# Patient Record
Sex: Female | Born: 1965 | Race: White | Hispanic: No | Smoking: Never smoker
Health system: Southern US, Community
[De-identification: ages and names within clinical notes are randomized; demographics above are authoritative.]

## PROBLEM LIST (undated history)

## (undated) DIAGNOSIS — N3281 Overactive bladder: Secondary | ICD-10-CM

## (undated) DIAGNOSIS — E669 Obesity, unspecified: Secondary | ICD-10-CM

## (undated) DIAGNOSIS — K219 Gastro-esophageal reflux disease without esophagitis: Secondary | ICD-10-CM

## (undated) DIAGNOSIS — F32A Depression, unspecified: Secondary | ICD-10-CM

## (undated) HISTORY — PX: ESOPHAGOGASTRODUODENOSCOPY: SHX1529

## (undated) HISTORY — PX: UPPER GASTROINTESTINAL ENDOSCOPY: SHX188

## (undated) HISTORY — PX: CHOLECYSTECTOMY: SHX55

## (undated) HISTORY — DX: Depression, unspecified: F32.A

## (undated) HISTORY — DX: Obesity, unspecified: E66.9

## (undated) HISTORY — DX: Gastro-esophageal reflux disease without esophagitis: K21.9

## (undated) HISTORY — DX: Overactive bladder: N32.81

## (undated) HISTORY — PX: TUBAL LIGATION: SHX77

---

## 1992-09-10 DIAGNOSIS — E079 Disorder of thyroid, unspecified: Secondary | ICD-10-CM

## 1992-09-10 HISTORY — DX: Disorder of thyroid, unspecified: E07.9

## 1999-10-17 ENCOUNTER — Other Ambulatory Visit: Admission: RE | Admit: 1999-10-17 | Discharge: 1999-10-17 | Payer: Self-pay | Admitting: Gynecology

## 2000-10-31 ENCOUNTER — Other Ambulatory Visit: Admission: RE | Admit: 2000-10-31 | Discharge: 2000-10-31 | Payer: Self-pay | Admitting: Gynecology

## 2001-11-24 ENCOUNTER — Other Ambulatory Visit: Admission: RE | Admit: 2001-11-24 | Discharge: 2001-11-24 | Payer: Self-pay | Admitting: Obstetrics and Gynecology

## 2003-04-15 ENCOUNTER — Other Ambulatory Visit: Admission: RE | Admit: 2003-04-15 | Discharge: 2003-04-15 | Payer: Self-pay | Admitting: Obstetrics and Gynecology

## 2003-09-11 HISTORY — PX: CHOLECYSTECTOMY: SHX55

## 2004-06-01 ENCOUNTER — Other Ambulatory Visit: Admission: RE | Admit: 2004-06-01 | Discharge: 2004-06-01 | Payer: Self-pay | Admitting: Obstetrics and Gynecology

## 2005-07-25 ENCOUNTER — Other Ambulatory Visit: Admission: RE | Admit: 2005-07-25 | Discharge: 2005-07-25 | Payer: Self-pay | Admitting: Obstetrics and Gynecology

## 2007-05-08 ENCOUNTER — Encounter: Admission: RE | Admit: 2007-05-08 | Discharge: 2007-05-08 | Payer: Self-pay | Admitting: Obstetrics and Gynecology

## 2008-05-24 ENCOUNTER — Encounter: Admission: RE | Admit: 2008-05-24 | Discharge: 2008-05-24 | Payer: Self-pay | Admitting: Obstetrics and Gynecology

## 2009-06-08 ENCOUNTER — Encounter: Admission: RE | Admit: 2009-06-08 | Discharge: 2009-06-08 | Payer: Self-pay | Admitting: Obstetrics and Gynecology

## 2009-06-14 ENCOUNTER — Encounter: Admission: RE | Admit: 2009-06-14 | Discharge: 2009-06-14 | Payer: Self-pay | Admitting: Obstetrics and Gynecology

## 2009-09-10 HISTORY — PX: TUBAL LIGATION: SHX77

## 2010-01-12 ENCOUNTER — Ambulatory Visit (HOSPITAL_COMMUNITY): Admission: RE | Admit: 2010-01-12 | Discharge: 2010-01-12 | Payer: Self-pay | Admitting: Obstetrics and Gynecology

## 2010-06-20 ENCOUNTER — Encounter: Admission: RE | Admit: 2010-06-20 | Discharge: 2010-06-20 | Payer: Self-pay | Admitting: Obstetrics and Gynecology

## 2010-10-01 ENCOUNTER — Encounter: Payer: Self-pay | Admitting: Obstetrics and Gynecology

## 2010-11-28 LAB — CBC
HCT: 37.8 % (ref 36.0–46.0)
Hemoglobin: 13 g/dL (ref 12.0–15.0)
MCHC: 34.4 g/dL (ref 30.0–36.0)
MCV: 87.6 fL (ref 78.0–100.0)
Platelets: 191 10*3/uL (ref 150–400)
RDW: 13 % (ref 11.5–15.5)

## 2011-05-30 ENCOUNTER — Other Ambulatory Visit: Payer: Self-pay | Admitting: Obstetrics and Gynecology

## 2011-05-30 DIAGNOSIS — Z1231 Encounter for screening mammogram for malignant neoplasm of breast: Secondary | ICD-10-CM

## 2011-06-22 ENCOUNTER — Ambulatory Visit
Admission: RE | Admit: 2011-06-22 | Discharge: 2011-06-22 | Disposition: A | Discharge status: Home / Self Care | Payer: 59 | Source: Ambulatory Visit | Attending: Obstetrics and Gynecology | Admitting: Obstetrics and Gynecology

## 2011-06-22 DIAGNOSIS — Z1231 Encounter for screening mammogram for malignant neoplasm of breast: Secondary | ICD-10-CM

## 2012-06-19 ENCOUNTER — Other Ambulatory Visit: Payer: Self-pay | Admitting: Obstetrics and Gynecology

## 2012-06-19 DIAGNOSIS — Z1231 Encounter for screening mammogram for malignant neoplasm of breast: Secondary | ICD-10-CM

## 2012-07-07 ENCOUNTER — Ambulatory Visit
Admission: RE | Admit: 2012-07-07 | Discharge: 2012-07-07 | Disposition: A | Discharge status: Home / Self Care | Payer: 59 | Source: Ambulatory Visit | Attending: Obstetrics and Gynecology | Admitting: Obstetrics and Gynecology

## 2012-07-07 DIAGNOSIS — Z1231 Encounter for screening mammogram for malignant neoplasm of breast: Secondary | ICD-10-CM

## 2013-07-08 ENCOUNTER — Other Ambulatory Visit: Payer: Self-pay

## 2013-07-08 DIAGNOSIS — Z1231 Encounter for screening mammogram for malignant neoplasm of breast: Secondary | ICD-10-CM

## 2013-08-31 ENCOUNTER — Ambulatory Visit
Admission: RE | Admit: 2013-08-31 | Discharge: 2013-08-31 | Disposition: A | Discharge status: Home / Self Care | Payer: BC Managed Care – PPO | Source: Ambulatory Visit

## 2013-08-31 DIAGNOSIS — Z1231 Encounter for screening mammogram for malignant neoplasm of breast: Secondary | ICD-10-CM

## 2014-08-13 ENCOUNTER — Other Ambulatory Visit: Payer: Self-pay

## 2014-08-13 DIAGNOSIS — Z1231 Encounter for screening mammogram for malignant neoplasm of breast: Secondary | ICD-10-CM

## 2014-09-01 ENCOUNTER — Ambulatory Visit
Admission: RE | Admit: 2014-09-01 | Discharge: 2014-09-01 | Disposition: A | Discharge status: Home / Self Care | Payer: BC Managed Care – PPO | Source: Ambulatory Visit

## 2014-09-01 DIAGNOSIS — Z1231 Encounter for screening mammogram for malignant neoplasm of breast: Secondary | ICD-10-CM

## 2015-02-10 DIAGNOSIS — M222X2 Patellofemoral disorders, left knee: Secondary | ICD-10-CM | POA: Insufficient documentation

## 2015-02-10 DIAGNOSIS — M25562 Pain in left knee: Secondary | ICD-10-CM | POA: Insufficient documentation

## 2015-12-14 ENCOUNTER — Other Ambulatory Visit: Payer: Self-pay

## 2015-12-14 DIAGNOSIS — Z1231 Encounter for screening mammogram for malignant neoplasm of breast: Secondary | ICD-10-CM

## 2016-01-03 ENCOUNTER — Other Ambulatory Visit: Payer: Self-pay | Admitting: Obstetrics and Gynecology

## 2016-01-03 ENCOUNTER — Ambulatory Visit
Admission: RE | Admit: 2016-01-03 | Discharge: 2016-01-03 | Disposition: A | Discharge status: Home / Self Care | Payer: Managed Care, Other (non HMO) | Source: Ambulatory Visit

## 2016-01-03 DIAGNOSIS — Z1231 Encounter for screening mammogram for malignant neoplasm of breast: Secondary | ICD-10-CM

## 2016-01-04 LAB — CYTOLOGY - PAP

## 2018-05-05 DIAGNOSIS — E049 Nontoxic goiter, unspecified: Secondary | ICD-10-CM | POA: Insufficient documentation

## 2018-05-05 DIAGNOSIS — R1033 Periumbilical pain: Secondary | ICD-10-CM | POA: Insufficient documentation

## 2018-05-10 HISTORY — PX: UMBILICAL HERNIA REPAIR: SHX196

## 2019-05-01 DIAGNOSIS — F4320 Adjustment disorder, unspecified: Secondary | ICD-10-CM | POA: Diagnosis not present

## 2019-05-01 DIAGNOSIS — J209 Acute bronchitis, unspecified: Secondary | ICD-10-CM | POA: Diagnosis not present

## 2019-08-18 DIAGNOSIS — Z20828 Contact with and (suspected) exposure to other viral communicable diseases: Secondary | ICD-10-CM | POA: Diagnosis not present

## 2019-08-30 DIAGNOSIS — R110 Nausea: Secondary | ICD-10-CM | POA: Diagnosis not present

## 2019-08-30 DIAGNOSIS — Z20828 Contact with and (suspected) exposure to other viral communicable diseases: Secondary | ICD-10-CM | POA: Diagnosis not present

## 2019-08-30 DIAGNOSIS — R5382 Chronic fatigue, unspecified: Secondary | ICD-10-CM | POA: Diagnosis not present

## 2019-08-31 DIAGNOSIS — U071 COVID-19: Secondary | ICD-10-CM | POA: Diagnosis not present

## 2019-08-31 DIAGNOSIS — R918 Other nonspecific abnormal finding of lung field: Secondary | ICD-10-CM | POA: Diagnosis not present

## 2019-08-31 DIAGNOSIS — R7989 Other specified abnormal findings of blood chemistry: Secondary | ICD-10-CM | POA: Diagnosis not present

## 2019-09-01 DIAGNOSIS — R7989 Other specified abnormal findings of blood chemistry: Secondary | ICD-10-CM | POA: Diagnosis not present

## 2020-01-06 DIAGNOSIS — F4320 Adjustment disorder, unspecified: Secondary | ICD-10-CM | POA: Diagnosis not present

## 2020-01-06 DIAGNOSIS — Z6841 Body Mass Index (BMI) 40.0 and over, adult: Secondary | ICD-10-CM | POA: Diagnosis not present

## 2020-04-05 DIAGNOSIS — L300 Nummular dermatitis: Secondary | ICD-10-CM | POA: Diagnosis not present

## 2020-04-05 DIAGNOSIS — L299 Pruritus, unspecified: Secondary | ICD-10-CM | POA: Diagnosis not present

## 2020-04-05 DIAGNOSIS — L820 Inflamed seborrheic keratosis: Secondary | ICD-10-CM | POA: Diagnosis not present

## 2020-04-30 DIAGNOSIS — J209 Acute bronchitis, unspecified: Secondary | ICD-10-CM | POA: Diagnosis not present

## 2020-04-30 DIAGNOSIS — Z20828 Contact with and (suspected) exposure to other viral communicable diseases: Secondary | ICD-10-CM | POA: Diagnosis not present

## 2020-09-21 DIAGNOSIS — Z20828 Contact with and (suspected) exposure to other viral communicable diseases: Secondary | ICD-10-CM | POA: Diagnosis not present

## 2020-09-21 DIAGNOSIS — R051 Acute cough: Secondary | ICD-10-CM | POA: Diagnosis not present

## 2020-09-21 DIAGNOSIS — R509 Fever, unspecified: Secondary | ICD-10-CM | POA: Diagnosis not present

## 2021-03-02 DIAGNOSIS — Z1331 Encounter for screening for depression: Secondary | ICD-10-CM | POA: Diagnosis not present

## 2021-03-02 DIAGNOSIS — F4320 Adjustment disorder, unspecified: Secondary | ICD-10-CM | POA: Diagnosis not present

## 2021-03-02 DIAGNOSIS — Z6841 Body Mass Index (BMI) 40.0 and over, adult: Secondary | ICD-10-CM | POA: Diagnosis not present

## 2021-03-06 ENCOUNTER — Other Ambulatory Visit: Payer: Self-pay | Admitting: Obstetrics and Gynecology

## 2021-03-06 DIAGNOSIS — Z1231 Encounter for screening mammogram for malignant neoplasm of breast: Secondary | ICD-10-CM

## 2021-03-08 ENCOUNTER — Other Ambulatory Visit: Payer: Self-pay

## 2021-03-08 ENCOUNTER — Ambulatory Visit
Admission: RE | Admit: 2021-03-08 | Discharge: 2021-03-08 | Disposition: A | Discharge status: Home / Self Care | Payer: Managed Care, Other (non HMO) | Source: Ambulatory Visit | Attending: Obstetrics and Gynecology | Admitting: Obstetrics and Gynecology

## 2021-03-08 DIAGNOSIS — Z1231 Encounter for screening mammogram for malignant neoplasm of breast: Secondary | ICD-10-CM

## 2021-03-08 DIAGNOSIS — M20012 Mallet finger of left finger(s): Secondary | ICD-10-CM | POA: Diagnosis not present

## 2021-03-08 DIAGNOSIS — S62637A Displaced fracture of distal phalanx of left little finger, initial encounter for closed fracture: Secondary | ICD-10-CM | POA: Diagnosis not present

## 2021-03-09 DIAGNOSIS — M20012 Mallet finger of left finger(s): Secondary | ICD-10-CM | POA: Diagnosis not present

## 2021-03-09 DIAGNOSIS — S62637A Displaced fracture of distal phalanx of left little finger, initial encounter for closed fracture: Secondary | ICD-10-CM | POA: Diagnosis not present

## 2021-03-21 DIAGNOSIS — M20012 Mallet finger of left finger(s): Secondary | ICD-10-CM | POA: Diagnosis not present

## 2021-03-21 DIAGNOSIS — S62637A Displaced fracture of distal phalanx of left little finger, initial encounter for closed fracture: Secondary | ICD-10-CM | POA: Diagnosis not present

## 2021-03-23 DIAGNOSIS — Z8616 Personal history of COVID-19: Secondary | ICD-10-CM | POA: Insufficient documentation

## 2021-03-23 DIAGNOSIS — E049 Nontoxic goiter, unspecified: Secondary | ICD-10-CM | POA: Diagnosis not present

## 2021-03-23 DIAGNOSIS — Z01419 Encounter for gynecological examination (general) (routine) without abnormal findings: Secondary | ICD-10-CM | POA: Diagnosis not present

## 2021-03-23 DIAGNOSIS — Z124 Encounter for screening for malignant neoplasm of cervix: Secondary | ICD-10-CM | POA: Diagnosis not present

## 2021-05-16 DIAGNOSIS — Z6841 Body Mass Index (BMI) 40.0 and over, adult: Secondary | ICD-10-CM | POA: Diagnosis not present

## 2021-05-16 DIAGNOSIS — F4320 Adjustment disorder, unspecified: Secondary | ICD-10-CM | POA: Diagnosis not present

## 2021-05-16 DIAGNOSIS — R609 Edema, unspecified: Secondary | ICD-10-CM | POA: Diagnosis not present

## 2021-07-04 ENCOUNTER — Encounter: Payer: Self-pay | Admitting: Gastroenterology

## 2021-07-26 ENCOUNTER — Telehealth: Payer: Self-pay

## 2021-07-26 NOTE — Telephone Encounter (Signed)
Attempted to reach patient- unable to leave a VM as "VM is not set up"-  Patient's BMI listed in chart for referral is 51;  Attempting to reach patient and confirm ht/wt for updated BMI   Will need to attempt to reach patient at a later date/time and may need to request either OV or direct form Dr. Chales Abrahams????

## 2021-07-26 NOTE — Telephone Encounter (Signed)
Patient returned call. Schedule OV 12/6 with Victorino Dike. Procedure is 12/15

## 2021-08-08 ENCOUNTER — Encounter: Payer: Self-pay | Admitting: Gastroenterology

## 2021-08-15 ENCOUNTER — Ambulatory Visit: Payer: BC Managed Care – PPO | Admitting: Nurse Practitioner

## 2021-08-21 ENCOUNTER — Encounter: Payer: BC Managed Care – PPO | Admitting: Gastroenterology

## 2021-08-21 ENCOUNTER — Ambulatory Visit (INDEPENDENT_AMBULATORY_CARE_PROVIDER_SITE_OTHER): Payer: BC Managed Care – PPO | Admitting: Nurse Practitioner

## 2021-08-21 ENCOUNTER — Encounter: Payer: Self-pay | Admitting: Nurse Practitioner

## 2021-08-21 ENCOUNTER — Other Ambulatory Visit: Payer: Self-pay

## 2021-08-21 VITALS — BP 112/72 | HR 75 | Ht 62.5 in | Wt 278.0 lb

## 2021-08-21 DIAGNOSIS — K219 Gastro-esophageal reflux disease without esophagitis: Secondary | ICD-10-CM | POA: Insufficient documentation

## 2021-08-21 DIAGNOSIS — K625 Hemorrhage of anus and rectum: Secondary | ICD-10-CM | POA: Diagnosis not present

## 2021-08-21 DIAGNOSIS — Z1211 Encounter for screening for malignant neoplasm of colon: Secondary | ICD-10-CM

## 2021-08-21 DIAGNOSIS — R1310 Dysphagia, unspecified: Secondary | ICD-10-CM | POA: Diagnosis not present

## 2021-08-21 NOTE — Progress Notes (Signed)
08/21/2021 GINNIE SCHMUDE [id number] 09/28/1965   CHIEF COMPLAINT: Schedule a colonoscopy   HISTORY OF PRESENT ILLNESS:  Laura Hutchinson is a 55 year old female with a past medical history of morbid obesity, and GERD. Past C section, cholecystectomy and tubal ligation.  She presents to our office today as referred by Dr. Irene Pap to schedule a screening colonoscopy.  She denies ever having a screening colonoscopy.  She denies having any upper or lower abdominal pain.  She typically passes a normal formed brown bowel movement daily without straining.  She infrequently sees very small spots of red blood on the toilet tissue which has occurred 2 or 3 times over the past 3 months.  No associated anorectal pain.  Past history of thrombosed hemorrhoids which were excised by her PCP.  She has a history of GERD which is well controlled as long as she takes Pantoprazole 40 mg daily.  She develops heartburn if she skips 2 or 3 doses of pantoprazole.  She has infrequent dysphagia, she describes having food which gets stuck in her upper esophagus if she eats large pieces, talks while she eats or eats in a hurry which occurs once every few months.  She typically bends over the commode and the stuck food comes out.  She reported her last EGD with esophageal dilatation was in 2016 by Dr. Lyndel Safe.  I am not able to access these records at this time.  No known family history of esophageal, gastric or colon cancer.  CBC Latest Ref Rng & Units 01/11/2010  WBC 4.0 - 10.5 K/uL 5.4  Hemoglobin 12.0 - 15.0 g/dL 13.0  Hematocrit 36.0 - 46.0 % 37.8  Platelets 150 - 400 K/uL 191     Past Medical History:  Diagnosis Date   GERD (gastroesophageal reflux disease)    Obesity    Overactive bladder    at night   Past Surgical History:  Procedure Laterality Date   CESAREAN SECTION  08/28/1994   CHOLECYSTECTOMY     ESOPHAGOGASTRODUODENOSCOPY     x 2 with dilation, Dr Lyndel Safe   TUBAL LIGATION      with albation   UMBILICAL HERNIA REPAIR  05/10/2018   in [place]  She reported having prolonged sedation with anesthesia use at the time of her tubal ligation/ablation.  Social History: Non-smoker she drinks one beer or mixed drink or glass of wine 10 times monthly. No drug use.  Family History: Paternal grandmother had pancreatic cancer.   Allergies  Allergen Reactions   Guaifenesin Hives     Outpatient Encounter Medications as of 08/21/2021  Medication Sig   citalopram (CELEXA) 20 MG tablet Take 20 mg by mouth daily.   MYRBETRIQ 25 MG TB24 tablet Take 25 mg by mouth daily.   pantoprazole (PROTONIX) 40 MG tablet Take 40 mg by mouth daily.   [DISCONTINUED] dexlansoprazole (DEXILANT) 60 MG capsule 60 mg.   [DISCONTINUED] pantoprazole (PROTONIX) 40 MG tablet 40 mg.   No facility-administered encounter medications on file as of 08/21/2021.    REVIEW OF SYSTEMS:  Gen: Denies fever, sweats or chills. No weight loss.  CV: Denies chest pain, palpitations or edema. Resp: Denies cough, shortness of breath of hemoptysis.  GI: See HPI.   GU : Denies urinary burning, blood in urine, increased urinary frequency or incontinence. MS: Denies joint pain, muscles aches or weakness. Derm: Denies rash, itchiness, skin lesions or unhealing ulcers. Psych: Denies depression, anxiety or memory loss. Heme: Denies bruising, bleeding. Neuro:  Denies headaches, dizziness or paresthesias. Endo:  Denies any problems with DM, thyroid or adrenal function.   PHYSICAL EXAM: BP 112/72   Pulse 75   Ht 5' 2.5" (1.588 m)   Wt 278 lb (126.1 kg)   BMI 50.04 kg/m  BMI 51.2 on 03/23/2021 General: 55 year old female in no acute distress. Head: Normocephalic and atraumatic. Eyes:  Sclerae non-icteric, conjunctive pink. Ears: Normal auditory acuity. Mouth: Dentition intact. No ulcers or lesions.  Neck: Supple, no lymphadenopathy or thyromegaly.  Lungs: Clear bilaterally to auscultation without wheezes,  crackles or rhonchi. Heart: Regular rate and rhythm. No murmur, rub or gallop appreciated.  Abdomen: Soft, nontender, non distended. No masses. No hepatosplenomegaly. Normoactive bowel sounds x 4 quadrants.  Rectal: Deferred. Musculoskeletal: Symmetrical with no gross deformities. Skin: Warm and dry. No rash or lesions on visible extremities. Extremities: No edema. Neurological: Alert oriented x 4, no focal deficits.  Psychological:  Alert and cooperative. Normal mood and affect.  ASSESSMENT AND PLAN:  59) 55 year old female with infrequent rectal bleeding. Never had a screening colonoscopy.  -Colonoscopy at [facility] benefits and risks discussed including risk with sedation, risk of bleeding, perforation and infection  -Patient to contact her office if she has increased rectal bleeding prior to her procedure date  2) GERD, dysphagia  -EGD at time of colonoscopy at [facility] benefits and risks discussed including risk with sedation, risk of bleeding, perforation and infection  -Patient instructed to cut food in small pieces, chew food thoroughly and avoid large pieces of meat and bread -Continue pantoprazole 40 mg daily -Patient to contact her office if her symptoms worsen  The patient was initially scheduled for a colonoscopy with Dr. Chales Abrahams at [facility] 09/2021, however, her prior  BMI was  51.2 therefore her procedure will need to be scheduled at [facility].  Today's BMI is barely over 50, however, as the holiday season approaches she was not confident she would maintain her current weight and likely would gain weight. She is intermittent dysphagia and is dependent on PPI therefore I added an EGD to her colonoscopy orders.  She agreed to schedule an EGD and colonoscopy at [facility].  I sent a message to Dr. Urban Gibson nurse Darrin Luis to contact the patient to schedule an EGD and colonoscopy with Dr. Chales Abrahams at [facility].          CC:  Lise Auer, MD

## 2021-08-21 NOTE — Patient Instructions (Signed)
If you are age 53 or older, your body mass index should be between 23-30. Your Body mass index is 50.04 kg/m. If this is out of the aforementioned range listed, please consider follow up with your Primary Care Provider.  The [facility] providers would like to encourage you to use [facility] to communicate with providers for non-urgent requests or questions.  Due to long hold times on the telephone, sending your provider a message by [facility] may be faster and more efficient way to get a response. Please allow 48 business hours for a response.  Please remember that this is for non-urgent requests/questions.  You will be contacted by our office once a schedule time for Dr. Chales Abrahams opens up at [facility].  Call our office if you develop worsening reflux, difficulty swallowing or increased rectal bleeding.  It was great seeing you today! Thank you for entrusting me with your care and choosing [facility]  Arnaldo Natal, CRNP

## 2021-08-23 ENCOUNTER — Telehealth: Payer: Self-pay

## 2021-08-23 NOTE — Telephone Encounter (Signed)
-----   Message from Arnaldo Natal, NP sent at 08/21/2021  5:09 PM EST ----- Dr. Chales Abrahams, patient will need EGD and colonoscopy at [facility] due to  BMI > 50. She did not think she would lose weight prior to her colonoscopy date 09/2021 scheduled at [facility]. Therefore, she will need EGD and colonoscopy at [facility] and you did not have any available hospital dates in February.   Viviann Spare, pls communicate with Dr. Chales Abrahams to schedule an EGD and colonoscopy at [facility].  Can you also locate the patient's past EGDs done by Dr. Chales Abrahams and have these documents scanned into epic.    The patient was informed you would contact her to schedule the EGD and colonoscopy when she coordinated dates with Dr. Chales Abrahams.  Thank you.

## 2021-08-23 NOTE — Telephone Encounter (Signed)
Pt notified that we will contact her when the appointments are available in March for her EGD/ Colon.  Pt stated that she had her recent EGD done at [facility]. [facility] was contacted and Elon Jester in records stated that she would fax the records over to Dr. Chales Abrahams office in [place]. [place] CMA made aware

## 2021-08-24 ENCOUNTER — Encounter: Payer: BC Managed Care – PPO | Admitting: Gastroenterology

## 2021-09-11 NOTE — Progress Notes (Signed)
Agree with excellent note and plan RG 

## 2021-09-15 ENCOUNTER — Telehealth: Payer: Self-pay

## 2021-09-15 NOTE — Telephone Encounter (Signed)
-----   Message from Emeline Darling, RN sent at 08/23/2021 10:50 AM EST ----- Regarding: EGD/ Colonoscopy Dr. Chales Abrahams, patient will need EGD and colonoscopy at [facility] due to BMI > 50. She did not think she would lose weight prior to her colonoscopy date 09/2021 scheduled at [facility]. Therefore, she will need EGD and colonoscopy at [facility] and you did not have any available hospital dates in February.   Viviann Spare, pls communicate with Dr. Chales Abrahams to schedule an EGD and colonoscopy at [facility]. Can you also locate the patient's past EGDs done by Dr. Chales Abrahams and have these documents scanned into epic.    The patient was informed you would contact her to schedule the EGD and colonoscopy when she coordinated dates with Dr. Chales Abrahams. Thank you.   08/23/2021

## 2021-09-15 NOTE — Telephone Encounter (Signed)
Called pt to scheduled EGD/ Colon at Hospital with Dr. Gupta.  °Unable to reach pt due to Voice mailbox not set up.  °

## 2021-09-18 NOTE — Telephone Encounter (Signed)
Called pt to scheduled EGD/ Colon at Hospital with Dr. Gupta.  °Unable to reach pt due to Voice mailbox not set up.  °

## 2021-09-19 NOTE — Telephone Encounter (Signed)
Called pt to scheduled EGD/ Colon at Hospital with Dr. Lyndel Safe.  Unable to reach pt due to Voice mailbox not set up.

## 2021-09-22 NOTE — Telephone Encounter (Signed)
Unable to reach pt by phone after  multiple attempts: A letter was created for the pt and sent requesting a callback so that we can schedule an EGD and Colonoscopy. Letter was sent Via Mail.

## 2021-10-04 ENCOUNTER — Encounter: Payer: BC Managed Care – PPO | Admitting: Gastroenterology

## 2021-12-24 IMAGING — MG DIGITAL SCREENING BILAT W/ CAD
4 series · 4 of 4 positions shown · non-contrast
Comparison: Previous exam(s).

CLINICAL DATA: Screening.

EXAM:
DIGITAL SCREENING BILATERAL MAMMOGRAM WITH CAD
TECHNIQUE: Bilateral screening digital craniocaudal and mediolateral oblique
mammograms were obtained. The images were evaluated with
computer-aided detection.

[L MLO]
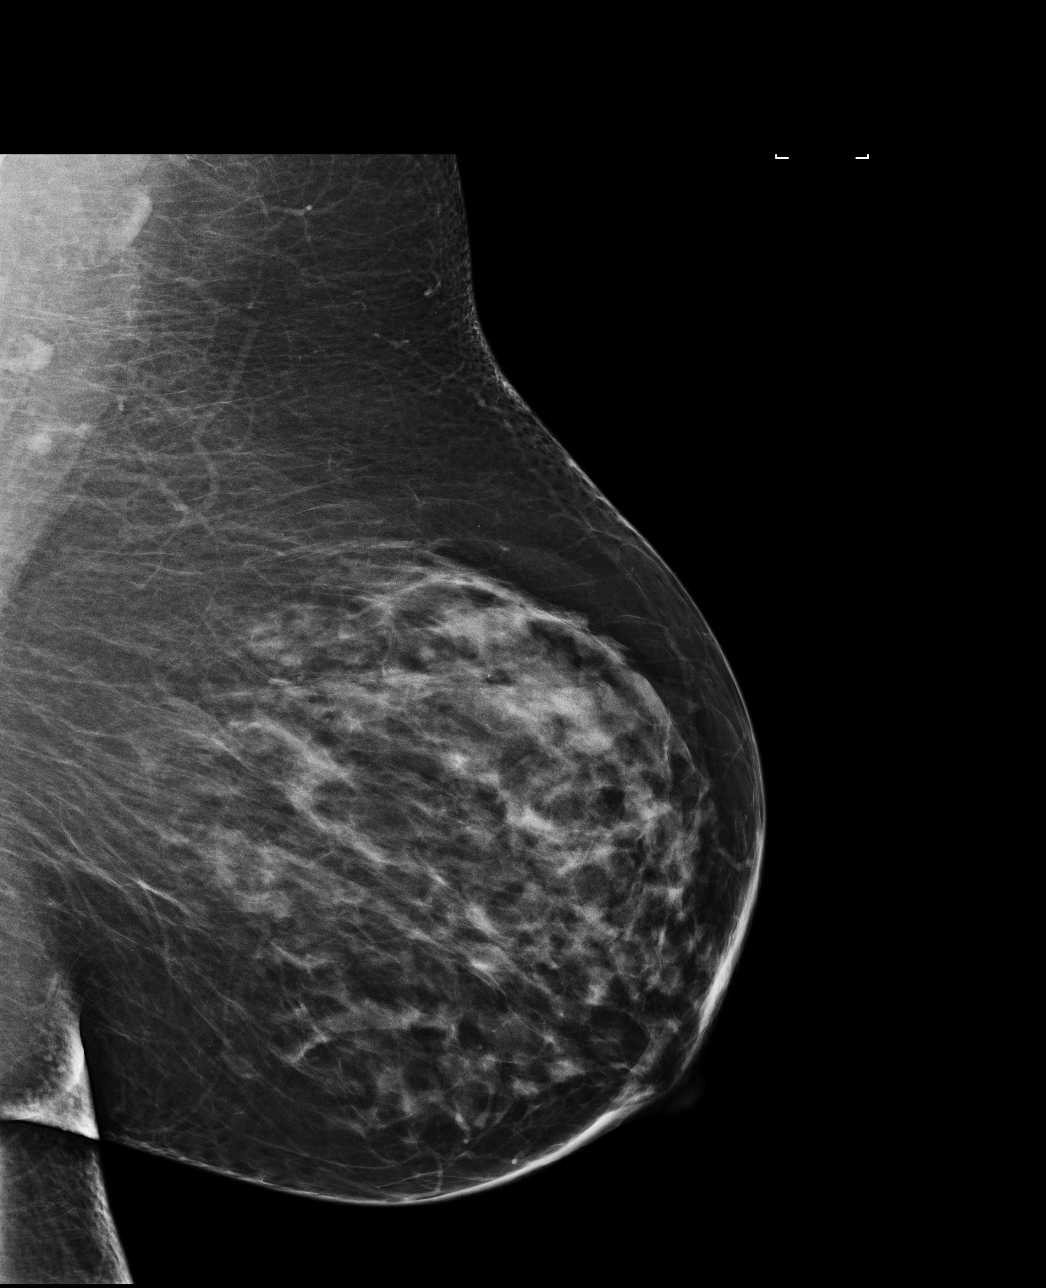

[R MLO]
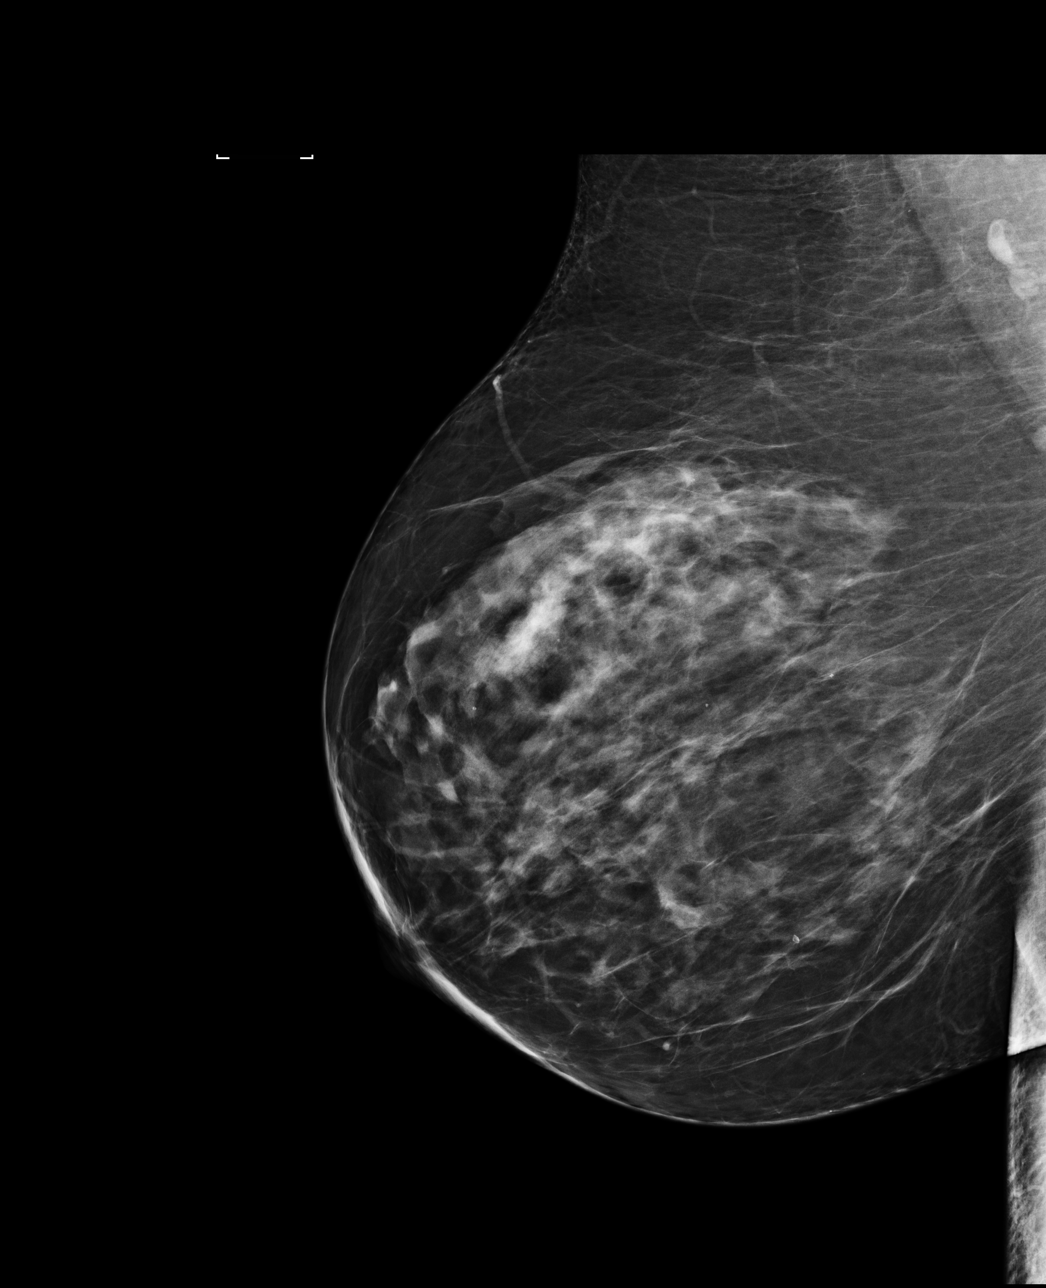

[R CC]
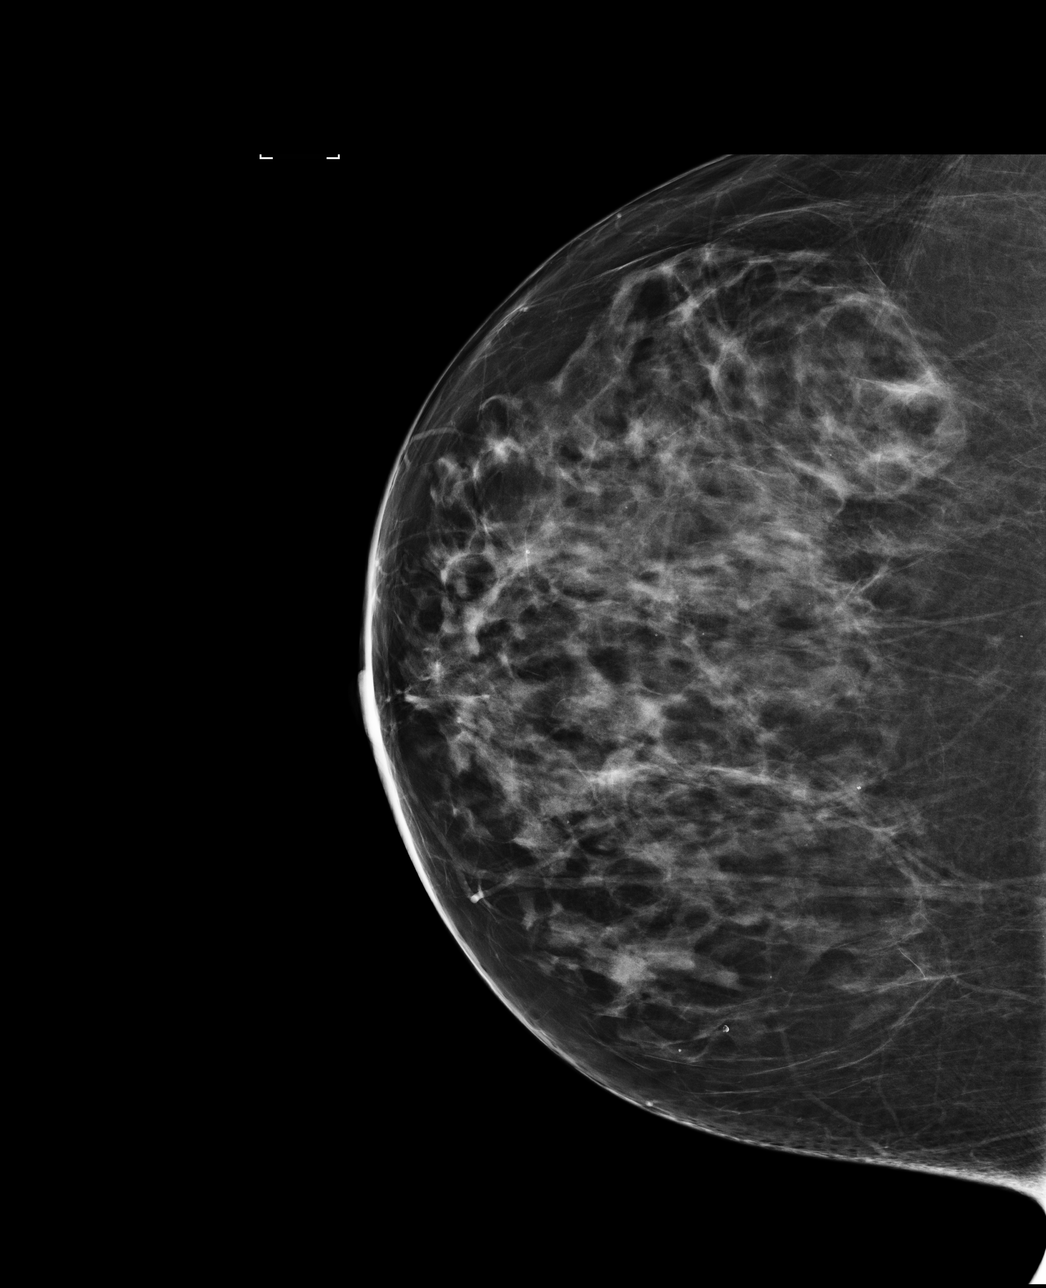

[L CC]
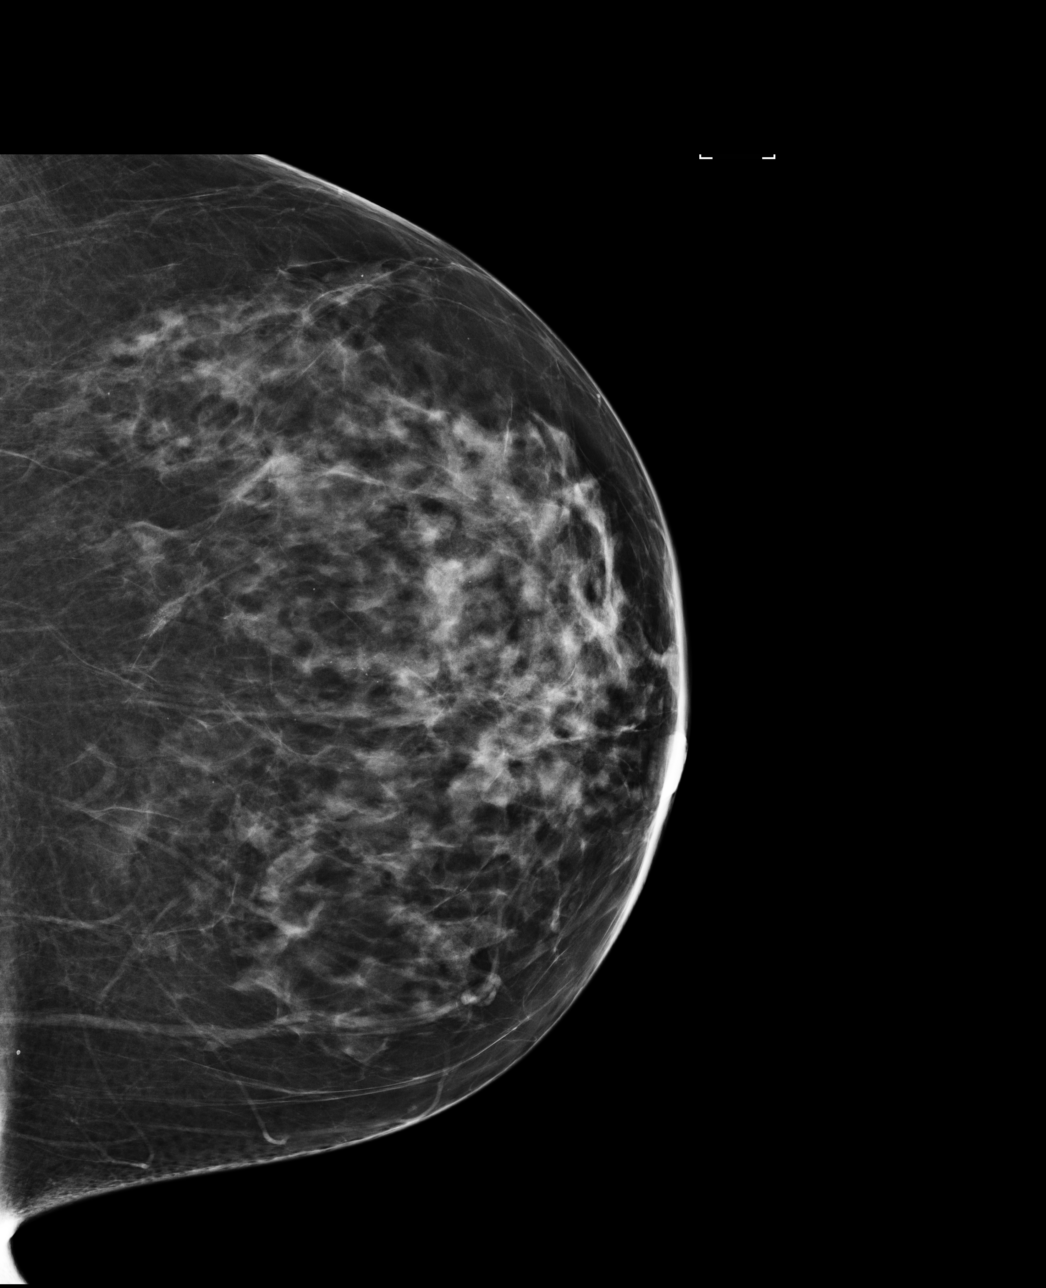

[4 of 4 positions shown; findings below may reference images not displayed]

ACR Breast Density Category c: The breast tissue is heterogeneously
dense, which may obscure small masses.
FINDINGS: There are no findings suspicious for malignancy.
IMPRESSION: No mammographic evidence of malignancy. A result letter of this
screening mammogram will be mailed directly to the patient.

RECOMMENDATION:
Screening mammogram in one year. (Code:TJ-B-ZPA)

BI-RADS CATEGORY  1: Negative.

## 2022-01-07 DIAGNOSIS — S79912A Unspecified injury of left hip, initial encounter: Secondary | ICD-10-CM | POA: Diagnosis not present

## 2022-01-07 DIAGNOSIS — M25552 Pain in left hip: Secondary | ICD-10-CM | POA: Diagnosis not present

## 2022-01-07 DIAGNOSIS — I878 Other specified disorders of veins: Secondary | ICD-10-CM | POA: Diagnosis not present

## 2022-03-20 ENCOUNTER — Other Ambulatory Visit: Payer: Self-pay | Admitting: Obstetrics and Gynecology

## 2022-03-20 DIAGNOSIS — Z1231 Encounter for screening mammogram for malignant neoplasm of breast: Secondary | ICD-10-CM

## 2022-03-20 NOTE — Telephone Encounter (Signed)
Calling to get her procedures scheduled at the hospital. Advised we tried many times in the past and we were unable to reach her. Tells me she is off this week and has better access to answer her calls. Can be reached at [phone].

## 2022-03-22 ENCOUNTER — Ambulatory Visit: Payer: BC Managed Care – PPO

## 2022-03-22 ENCOUNTER — Other Ambulatory Visit: Payer: Self-pay

## 2022-03-22 DIAGNOSIS — Z1211 Encounter for screening for malignant neoplasm of colon: Secondary | ICD-10-CM

## 2022-03-22 DIAGNOSIS — K219 Gastro-esophageal reflux disease without esophagitis: Secondary | ICD-10-CM

## 2022-03-22 DIAGNOSIS — R1310 Dysphagia, unspecified: Secondary | ICD-10-CM

## 2022-03-22 NOTE — Telephone Encounter (Signed)
Pt was scheduled for an EGD/ Colonoscopy on 06/07/2022 at [facility] with Dr. Chales Abrahams. Start time 9:45: Case number [id number]    Pt made aware  Pt was scheduled for a previsit appointment on 05/04/2022 at 11:00. In Person. Location given  Pt verbalized understanding with all questions answered.

## 2022-04-03 ENCOUNTER — Ambulatory Visit
Admission: RE | Admit: 2022-04-03 | Discharge: 2022-04-03 | Disposition: A | Discharge status: Home / Self Care | Payer: BC Managed Care – PPO | Source: Ambulatory Visit | Attending: Obstetrics and Gynecology | Admitting: Obstetrics and Gynecology

## 2022-04-03 ENCOUNTER — Ambulatory Visit: Payer: BC Managed Care – PPO

## 2022-04-03 DIAGNOSIS — Z1231 Encounter for screening mammogram for malignant neoplasm of breast: Secondary | ICD-10-CM | POA: Diagnosis not present

## 2022-04-05 ENCOUNTER — Other Ambulatory Visit: Payer: Self-pay | Admitting: Obstetrics and Gynecology

## 2022-04-05 DIAGNOSIS — R928 Other abnormal and inconclusive findings on diagnostic imaging of breast: Secondary | ICD-10-CM

## 2022-04-20 ENCOUNTER — Other Ambulatory Visit: Payer: Self-pay | Admitting: Obstetrics and Gynecology

## 2022-04-20 ENCOUNTER — Ambulatory Visit
Admission: RE | Admit: 2022-04-20 | Discharge: 2022-04-20 | Disposition: A | Discharge status: Home / Self Care | Payer: BC Managed Care – PPO | Source: Ambulatory Visit | Attending: Obstetrics and Gynecology | Admitting: Obstetrics and Gynecology

## 2022-04-20 DIAGNOSIS — R928 Other abnormal and inconclusive findings on diagnostic imaging of breast: Secondary | ICD-10-CM | POA: Diagnosis not present

## 2022-04-20 DIAGNOSIS — R921 Mammographic calcification found on diagnostic imaging of breast: Secondary | ICD-10-CM | POA: Diagnosis not present

## 2022-04-24 DIAGNOSIS — Z6841 Body Mass Index (BMI) 40.0 and over, adult: Secondary | ICD-10-CM | POA: Diagnosis not present

## 2022-04-24 DIAGNOSIS — F4320 Adjustment disorder, unspecified: Secondary | ICD-10-CM | POA: Diagnosis not present

## 2022-05-01 DIAGNOSIS — Z6841 Body Mass Index (BMI) 40.0 and over, adult: Secondary | ICD-10-CM | POA: Diagnosis not present

## 2022-05-01 DIAGNOSIS — Z01419 Encounter for gynecological examination (general) (routine) without abnormal findings: Secondary | ICD-10-CM | POA: Diagnosis not present

## 2022-05-02 ENCOUNTER — Ambulatory Visit
Admission: RE | Admit: 2022-05-02 | Discharge: 2022-05-02 | Disposition: A | Discharge status: Home / Self Care | Payer: BC Managed Care – PPO | Source: Ambulatory Visit | Attending: Obstetrics and Gynecology | Admitting: Obstetrics and Gynecology

## 2022-05-02 DIAGNOSIS — R921 Mammographic calcification found on diagnostic imaging of breast: Secondary | ICD-10-CM

## 2022-05-02 DIAGNOSIS — N6012 Diffuse cystic mastopathy of left breast: Secondary | ICD-10-CM | POA: Diagnosis not present

## 2022-05-02 HISTORY — PX: BREAST BIOPSY: SHX20

## 2022-05-04 ENCOUNTER — Ambulatory Visit (AMBULATORY_SURGERY_CENTER): Payer: BC Managed Care – PPO

## 2022-05-04 VITALS — Ht 62.5 in | Wt 273.2 lb

## 2022-05-04 DIAGNOSIS — Z1211 Encounter for screening for malignant neoplasm of colon: Secondary | ICD-10-CM

## 2022-05-04 DIAGNOSIS — K219 Gastro-esophageal reflux disease without esophagitis: Secondary | ICD-10-CM

## 2022-05-04 DIAGNOSIS — R1310 Dysphagia, unspecified: Secondary | ICD-10-CM

## 2022-05-04 DIAGNOSIS — Z111 Encounter for screening for respiratory tuberculosis: Secondary | ICD-10-CM | POA: Diagnosis not present

## 2022-05-04 MED ORDER — NA SULFATE-K SULFATE-MG SULF 17.5-3.13-1.6 GM/177ML PO SOLN: 1.0000 | Freq: Once | ORAL | 0 refills | Status: AC

## 2022-05-04 NOTE — Progress Notes (Signed)
Endocolon sched at [facility] due to BMI.  Pt has hx of goiter, not removed but states with thyroid medication it has stayed "stable"    No egg or soy allergy known to patient  No issues known to pt with past sedation with any surgeries or procedures Patient denies ever being told they had issues or difficulty with intubation  No FH of Malignant Hyperthermia Pt is not on diet pills Pt is not on  home 02  Pt is not on blood thinners  Pt denies issues with constipation  No A fib or A flutter  Have any cardiac testing pending--no  Pt instructed to use Singlecare.com or GoodRx for a price reduction on prep

## 2022-05-15 DIAGNOSIS — Z6841 Body Mass Index (BMI) 40.0 and over, adult: Secondary | ICD-10-CM | POA: Diagnosis not present

## 2022-05-15 DIAGNOSIS — N6092 Unspecified benign mammary dysplasia of left breast: Secondary | ICD-10-CM | POA: Diagnosis not present

## 2022-05-15 DIAGNOSIS — Z111 Encounter for screening for respiratory tuberculosis: Secondary | ICD-10-CM | POA: Diagnosis not present

## 2022-05-17 DIAGNOSIS — K219 Gastro-esophageal reflux disease without esophagitis: Secondary | ICD-10-CM | POA: Diagnosis not present

## 2022-05-17 DIAGNOSIS — N6092 Unspecified benign mammary dysplasia of left breast: Secondary | ICD-10-CM | POA: Diagnosis not present

## 2022-05-17 DIAGNOSIS — N6082 Other benign mammary dysplasias of left breast: Secondary | ICD-10-CM | POA: Diagnosis not present

## 2022-05-17 DIAGNOSIS — R928 Other abnormal and inconclusive findings on diagnostic imaging of breast: Secondary | ICD-10-CM | POA: Diagnosis not present

## 2022-05-17 DIAGNOSIS — Z791 Long term (current) use of non-steroidal anti-inflammatories (NSAID): Secondary | ICD-10-CM | POA: Diagnosis not present

## 2022-05-17 DIAGNOSIS — Z79899 Other long term (current) drug therapy: Secondary | ICD-10-CM | POA: Diagnosis not present

## 2022-05-17 DIAGNOSIS — F419 Anxiety disorder, unspecified: Secondary | ICD-10-CM | POA: Diagnosis not present

## 2022-05-22 ENCOUNTER — Encounter: Payer: Self-pay | Admitting: Gastroenterology

## 2022-05-31 ENCOUNTER — Encounter (HOSPITAL_COMMUNITY): Payer: Self-pay | Admitting: Gastroenterology

## 2022-06-07 ENCOUNTER — Encounter (HOSPITAL_COMMUNITY)
Admission: RE | Disposition: A | Discharge status: Home / Self Care | Payer: Self-pay | Source: Ambulatory Visit | Attending: Gastroenterology

## 2022-06-07 ENCOUNTER — Ambulatory Visit (HOSPITAL_COMMUNITY): Payer: BC Managed Care – PPO | Admitting: Anesthesiology

## 2022-06-07 ENCOUNTER — Other Ambulatory Visit: Payer: Self-pay

## 2022-06-07 ENCOUNTER — Ambulatory Visit (HOSPITAL_COMMUNITY)
Admission: RE | Admit: 2022-06-07 | Discharge: 2022-06-07 | Disposition: A | Discharge status: Home / Self Care | Payer: BC Managed Care – PPO | Source: Ambulatory Visit | Attending: Gastroenterology | Admitting: Gastroenterology

## 2022-06-07 DIAGNOSIS — Z6841 Body Mass Index (BMI) 40.0 and over, adult: Secondary | ICD-10-CM | POA: Insufficient documentation

## 2022-06-07 DIAGNOSIS — K643 Fourth degree hemorrhoids: Secondary | ICD-10-CM | POA: Insufficient documentation

## 2022-06-07 DIAGNOSIS — K319 Disease of stomach and duodenum, unspecified: Secondary | ICD-10-CM | POA: Diagnosis not present

## 2022-06-07 DIAGNOSIS — K649 Unspecified hemorrhoids: Secondary | ICD-10-CM | POA: Diagnosis not present

## 2022-06-07 DIAGNOSIS — R1310 Dysphagia, unspecified: Secondary | ICD-10-CM | POA: Insufficient documentation

## 2022-06-07 DIAGNOSIS — Z1211 Encounter for screening for malignant neoplasm of colon: Secondary | ICD-10-CM

## 2022-06-07 DIAGNOSIS — K219 Gastro-esophageal reflux disease without esophagitis: Secondary | ICD-10-CM | POA: Diagnosis not present

## 2022-06-07 DIAGNOSIS — Z79899 Other long term (current) drug therapy: Secondary | ICD-10-CM | POA: Diagnosis not present

## 2022-06-07 DIAGNOSIS — K5730 Diverticulosis of large intestine without perforation or abscess without bleeding: Secondary | ICD-10-CM | POA: Insufficient documentation

## 2022-06-07 DIAGNOSIS — K5790 Diverticulosis of intestine, part unspecified, without perforation or abscess without bleeding: Secondary | ICD-10-CM | POA: Diagnosis not present

## 2022-06-07 DIAGNOSIS — K2970 Gastritis, unspecified, without bleeding: Secondary | ICD-10-CM | POA: Diagnosis not present

## 2022-06-07 HISTORY — PX: ESOPHAGOGASTRODUODENOSCOPY (EGD) WITH PROPOFOL: SHX5813

## 2022-06-07 HISTORY — PX: BIOPSY: SHX5522

## 2022-06-07 HISTORY — PX: COLONOSCOPY WITH PROPOFOL: SHX5780

## 2022-06-07 HISTORY — PX: MALONEY DILATION: SHX5535

## 2022-06-07 SURGERY — COLONOSCOPY WITH PROPOFOL
Anesthesia: Monitor Anesthesia Care

## 2022-06-07 MED ORDER — PROPOFOL 500 MG/50ML IV EMUL
INTRAVENOUS | Status: DC | PRN
  Administered 2022-06-07: 125 ug/kg/min via INTRAVENOUS

## 2022-06-07 MED ORDER — LACTATED RINGERS IV SOLN
INTRAVENOUS | Status: AC | PRN
  Administered 2022-06-07: 1000 mL via INTRAVENOUS

## 2022-06-07 MED ORDER — SODIUM CHLORIDE 0.9 % IV SOLN: INTRAVENOUS | Status: DC

## 2022-06-07 MED ORDER — LIDOCAINE 2% (20 MG/ML) 5 ML SYRINGE
INTRAMUSCULAR | Status: DC | PRN
  Administered 2022-06-07: 60 mg via INTRAVENOUS
  Administered 2022-06-07: 40 mg via INTRAVENOUS

## 2022-06-07 MED ORDER — PROPOFOL 10 MG/ML IV BOLUS
INTRAVENOUS | Status: DC | PRN
  Administered 2022-06-07: 30 mg via INTRAVENOUS
  Administered 2022-06-07 (×2): 20 mg via INTRAVENOUS
  Administered 2022-06-07: 30 mg via INTRAVENOUS

## 2022-06-07 MED ORDER — LACTATED RINGERS IV SOLN: INTRAVENOUS | Status: DC

## 2022-06-07 MED ORDER — HYDROCORTISONE 1 % EX OINT: 1.0000 | TOPICAL_OINTMENT | Freq: Two times a day (BID) | CUTANEOUS | 0 refills | Status: AC

## 2022-06-07 MED ORDER — PROPOFOL 10 MG/ML IV BOLUS
INTRAVENOUS | Status: AC
  Filled 2022-06-07: qty 20

## 2022-06-07 MED ORDER — PROPOFOL 500 MG/50ML IV EMUL
INTRAVENOUS | Status: AC
  Filled 2022-06-07: qty 50

## 2022-06-07 SURGICAL SUPPLY — 25 items
BLOCK BITE 60FR ADLT L/F BLUE (MISCELLANEOUS) ×2 IMPLANT
ELECT REM PT RETURN 9FT ADLT (ELECTROSURGICAL)
ELECTRODE REM PT RTRN 9FT ADLT (ELECTROSURGICAL) IMPLANT
FCP BXJMBJMB 240X2.8X (CUTTING FORCEPS)
FLOOR PAD 36X40 (MISCELLANEOUS) ×2
FORCEP RJ3 GP 1.8X160 W-NEEDLE (CUTTING FORCEPS) IMPLANT
FORCEPS BIOP RAD 4 LRG CAP 4 (CUTTING FORCEPS) IMPLANT
FORCEPS BIOP RJ4 240 W/NDL (CUTTING FORCEPS)
FORCEPS BXJMBJMB 240X2.8X (CUTTING FORCEPS) IMPLANT
INJECTOR/SNARE I SNARE (MISCELLANEOUS) IMPLANT
LUBRICANT JELLY 4.5OZ STERILE (MISCELLANEOUS) IMPLANT
MANIFOLD NEPTUNE II (INSTRUMENTS) IMPLANT
NDL SCLEROTHERAPY 25GX240 (NEEDLE) IMPLANT
NEEDLE SCLEROTHERAPY 25GX240 (NEEDLE) IMPLANT
PAD FLOOR 36X40 (MISCELLANEOUS) ×2 IMPLANT
PROBE APC STR FIRE (PROBE) IMPLANT
PROBE INJECTION GOLD (MISCELLANEOUS)
PROBE INJECTION GOLD 7FR (MISCELLANEOUS) IMPLANT
SNARE ROTATE MED OVAL 20MM (MISCELLANEOUS) IMPLANT
SNARE SHORT THROW 13M SML OVAL (MISCELLANEOUS) IMPLANT
SYR 50ML LL SCALE MARK (SYRINGE) IMPLANT
TRAP SPECIMEN MUCOUS 40CC (MISCELLANEOUS) IMPLANT
TUBING ENDO SMARTCAP PENTAX (MISCELLANEOUS) ×4 IMPLANT
TUBING IRRIGATION ENDOGATOR (MISCELLANEOUS) ×4 IMPLANT
WATER STERILE IRR 1000ML POUR (IV SOLUTION) IMPLANT

## 2022-06-07 NOTE — Discharge Instructions (Signed)
YOU HAD AN ENDOSCOPIC PROCEDURE TODAY: Refer to the procedure report and other information in the discharge instructions given to you for any specific questions about what was found during the examination. If this information does not answer your questions, please call [place] office at [phone] to clarify.  ° °YOU SHOULD EXPECT: Some feelings of bloating in the abdomen. Passage of more gas than usual. Walking can help get rid of the air that was put into your GI tract during the procedure and reduce the bloating. If you had a lower endoscopy (such as a colonoscopy or flexible sigmoidoscopy) you may notice spotting of blood in your stool or on the toilet paper. Some abdominal soreness may be present for a day or two, also. ° °DIET: Your first meal following the procedure should be a light meal and then it is ok to progress to your normal diet. A half-sandwich or bowl of soup is an example of a good first meal. Heavy or fried foods are harder to digest and may make you feel nauseous or bloated. Drink plenty of fluids but you should avoid alcoholic beverages for 24 hours. If you had a esophageal dilation, please see attached instructions for diet.   ° °ACTIVITY: Your care partner should take you home directly after the procedure. You should plan to take it easy, moving slowly for the rest of the day. You can resume normal activity the day after the procedure however YOU SHOULD NOT DRIVE, use power tools, machinery or perform tasks that involve climbing or major physical exertion for 24 hours (because of the sedation medicines used during the test).  ° °SYMPTOMS TO REPORT IMMEDIATELY: °A gastroenterologist can be reached at any hour. Please call [phone]  for any of the following symptoms:  °Following lower endoscopy (colonoscopy, flexible sigmoidoscopy) °Excessive amounts of blood in the stool  °Significant tenderness, worsening of abdominal pains  °Swelling of the abdomen that is new, acute  °Fever of 100° or  higher  °Following upper endoscopy (EGD, EUS, ERCP, esophageal dilation) °Vomiting of blood or coffee ground material  °New, significant abdominal pain  °New, significant chest pain or pain under the shoulder blades  °Painful or persistently difficult swallowing  °New shortness of breath  °Black, tarry-looking or red, bloody stools ° °FOLLOW UP:  °If any biopsies were taken you will be contacted by phone or by letter within the next 1-3 weeks. Call [phone]  if you have not heard about the biopsies in 3 weeks.  °Please also call with any specific questions about appointments or follow up tests. ° °

## 2022-06-07 NOTE — Anesthesia Procedure Notes (Signed)
Procedure Name: MAC Date/Time: 06/07/2022 9:19 AM  Performed by: Lollie Sails, CRNAPre-anesthesia Checklist: Patient identified, Emergency Drugs available, Suction available, Patient being monitored and Timeout performed Oxygen Delivery Method: Nasal cannula Placement Confirmation: positive ETCO2

## 2022-06-07 NOTE — H&P (Signed)
08/21/2021 [name] [id number] 1966-02-15     CHIEF COMPLAINT: Schedule a colonoscopy    HISTORY OF PRESENT ILLNESS:  Laura Hutchinson is a 56 year old female with a past medical history of morbid obesity, and GERD. Past C section, cholecystectomy and tubal ligation.  She presents to our office today as referred by Dr. Irene Hutchinson to schedule a screening colonoscopy.  She denies ever having a screening colonoscopy.  She denies having any upper or lower abdominal pain.  She typically passes a normal formed brown bowel movement daily without straining.  She infrequently sees very small spots of red blood on the toilet tissue which has occurred 2 or 3 times over the past 3 months.  No associated anorectal pain.  Past history of thrombosed hemorrhoids which were excised by her PCP.  She has a history of GERD which is well controlled as long as she takes Pantoprazole 40 mg daily.  She develops heartburn if she skips 2 or 3 doses of pantoprazole.  She has infrequent dysphagia, she describes having food which gets stuck in her upper esophagus if she eats large pieces, talks while she eats or eats in a hurry which occurs once every few months.  She typically bends over the commode and the stuck food comes out.  She reported her last EGD with esophageal dilatation was in 2016 by Dr. Lyndel Hutchinson.  I am not able to access these records at this time.  No known family history of esophageal, gastric or colon cancer.   CBC Latest Ref Rng & Units 01/11/2010  WBC 4.0 - 10.5 K/uL 5.4  Hemoglobin 12.0 - 15.0 g/dL 13.0  Hematocrit 36.0 - 46.0 % 37.8  Platelets 150 - 400 K/uL 191          Past Medical History:  Diagnosis Date   GERD (gastroesophageal reflux disease)     Obesity     Overactive bladder      at night         Past Surgical History:  Procedure Laterality Date   CESAREAN SECTION   08/28/1994   CHOLECYSTECTOMY       ESOPHAGOGASTRODUODENOSCOPY        x 2 with dilation, Dr  Laura Hutchinson   TUBAL LIGATION        with albation   UMBILICAL HERNIA REPAIR   05/10/2018    in [place]  She reported having prolonged sedation with anesthesia use at the time of her tubal ligation/ablation.   Social History: Non-smoker she drinks one beer or mixed drink or glass of wine 10 times monthly. No drug use.   Family History: Paternal grandmother had pancreatic cancer.        Allergies  Allergen Reactions   Guaifenesin Hives          Outpatient Encounter Medications as of 08/21/2021  Medication Sig   citalopram (CELEXA) 20 MG tablet Take 20 mg by mouth daily.   MYRBETRIQ 25 MG TB24 tablet Take 25 mg by mouth daily.   pantoprazole (PROTONIX) 40 MG tablet Take 40 mg by mouth daily.   [DISCONTINUED] dexlansoprazole (DEXILANT) 60 MG capsule 60 mg.   [DISCONTINUED] pantoprazole (PROTONIX) 40 MG tablet 40 mg.    No facility-administered encounter medications on file as of 08/21/2021.      REVIEW OF SYSTEMS:  Gen: Denies fever, sweats or chills. No weight loss.  CV: Denies chest pain, palpitations or edema. Resp: Denies cough, shortness of breath of hemoptysis.  GI: See HPI.   GU : Denies urinary burning, blood in urine, increased urinary frequency or incontinence. MS: Denies joint pain, muscles aches or weakness. Derm: Denies rash, itchiness, skin lesions or unhealing ulcers. Psych: Denies depression, anxiety or memory loss. Heme: Denies bruising, bleeding. Neuro:  Denies headaches, dizziness or paresthesias. Endo:  Denies any problems with DM, thyroid or adrenal function.     PHYSICAL EXAM: BP 112/72   Pulse 75   Ht 5' 2.5" (1.588 m)   Wt 278 lb (126.1 kg)   BMI 50.04 kg/m  BMI 51.2 on 03/23/2021 General: 56 year old female in no acute distress. Head: Normocephalic and atraumatic. Eyes:  Sclerae non-icteric, conjunctive pink. Ears: Normal auditory acuity. Mouth: Dentition intact. No ulcers or lesions.  Neck: Supple, no lymphadenopathy or thyromegaly.  Lungs:  Clear bilaterally to auscultation without wheezes, crackles or rhonchi. Heart: Regular rate and rhythm. No murmur, rub or gallop appreciated.  Abdomen: Soft, nontender, non distended. No masses. No hepatosplenomegaly. Normoactive bowel sounds x 4 quadrants.  Rectal: Deferred. Musculoskeletal: Symmetrical with no gross deformities. Skin: Warm and dry. No rash or lesions on visible extremities. Extremities: No edema. Neurological: Alert oriented x 4, no focal deficits.  Psychological:  Alert and cooperative. Normal mood and affect.   ASSESSMENT AND PLAN:   69) 56 year old female with infrequent rectal bleeding. Never had a screening colonoscopy.  -Colonoscopy at [facility] benefits and risks discussed including risk with sedation, risk of bleeding, perforation and infection  -Patient to contact her office if she has increased rectal bleeding prior to her procedure date   2) GERD, dysphagia  -EGD at time of colonoscopy at [facility] benefits and risks discussed including risk with sedation, risk of bleeding, perforation and infection  -Patient instructed to cut food in small pieces, chew food thoroughly and avoid large pieces of meat and bread -Continue pantoprazole 40 mg daily -Patient to contact her office if her symptoms worsen   The patient was initially scheduled for a colonoscopy with Dr. Chales Hutchinson at [facility] 09/2021, however, her prior  BMI was  51.2 therefore her procedure will need to be scheduled at [facility].  Today's BMI is barely over 50, however, as the holiday season approaches she was not confident she would maintain her current weight and likely would gain weight. She is intermittent dysphagia and is dependent on PPI therefore I added an EGD to her colonoscopy orders.  She agreed to schedule an EGD and colonoscopy at [facility].   I sent a message to Dr. Urban Hutchinson nurse Laura Hutchinson to contact the patient to schedule an EGD and colonoscopy with Dr. Chales Hutchinson at [facility].    Attending physician's note   I have taken history, reviewed the chart and examined the patient. I performed a substantive portion of this encounter, including complete performance of at least one of the key components, in conjunction with the APP. I agree with the Advanced Practitioner's note, impression and recommendations.   For screening colonoscopy ( rectal bleeding has resolved) GERD with dysphagia-  EGD with dil   explained risks and benefits.   Edman Circle, MD [facility] [phone]

## 2022-06-07 NOTE — Op Note (Signed)
[facility] Patient Name: Laura Hutchinson Procedure Date: 06/07/2022 MRN: [id number] Attending MD: Jackquline Denmark , MD Date of Birth: 1966-08-13 CSN: [id number] Age: 56 Admit Type: Outpatient Procedure:                Colonoscopy Indications:              Screening for colorectal malignant neoplasm Providers:                Jackquline Denmark, MD, Burtis Junes, RN, Luan Moore,                            Technician, Brien Mates, RNFA Referring MD:             Dr Bertram Millard. Dr Blaine Hamper. Medicines:                Monitored Anesthesia Care Complications:            No immediate complications. Estimated Blood Loss:     Estimated blood loss: none. Procedure:                Pre-Anesthesia Assessment:                           - Prior to the procedure, a History and Physical                            was performed, and patient medications and                            allergies were reviewed. The patient's tolerance of                            previous anesthesia was also reviewed. The risks                            and benefits of the procedure and the sedation                            options and risks were discussed with the patient.                            All questions were answered, and informed consent                            was obtained. Prior Anticoagulants: The patient has                            taken no previous anticoagulant or antiplatelet                            agents. ASA Grade Assessment: II - A patient with                            mild systemic disease. After reviewing the risks  and benefits, the patient was deemed in                            satisfactory condition to undergo the procedure.                           After obtaining informed consent, the colonoscope                            was passed under direct vision. Throughout the                            procedure, the patient's blood  pressure, pulse, and                            oxygen saturations were monitored continuously. The                            CF-HQ190L ([id number]) Olympus colonoscope was                            introduced through the anus and advanced to the 2                            cm into the ileum. The colonoscopy was performed                            without difficulty. The patient tolerated the                            procedure well. The quality of the bowel                            preparation was good. The terminal ileum, ileocecal                            valve, appendiceal orifice, and rectum were                            photographed. Scope In: 9:40:57 AM Scope Out: 9:51:59 AM Scope Withdrawal Time: 0 hours 8 minutes 4 seconds  Total Procedure Duration: 0 hours 11 minutes 2 seconds  Findings:      Rare (2-3) medium-mouthed diverticula were found in the sigmoid colon       and ascending colon.      Non-bleeding internal hemorrhoids were found during retroflexion and       during perianal exam. The hemorrhoids were medium-sized and Grade IV       (internal hemorrhoids that prolapse and cannot be reduced manually).      The terminal ileum appeared normal.      The exam was otherwise without abnormality on direct and retroflexion       views. Impression:               - Mild pancolonic diverticulosis                           -  Non-bleeding internal hemorrhoids.                           - The examined portion of the ileum was normal.                           - The examination was otherwise normal on direct                            and retroflexion views.                           - No specimens collected. Moderate Sedation:      Not Applicable - Patient had care per Anesthesia. Recommendation:           - Patient has a contact number available for                            emergencies. The signs and symptoms of potential                            delayed complications  were discussed with the                            patient. Return to normal activities tomorrow.                            Written discharge instructions were provided to the                            patient.                           - Resume previous diet.                           - Continue present medications.                           - Repeat colonoscopy in 10 years for screening                            purposes. Earlier, if still with problems or change                            in FH.                           - Use HC Cream 2.5%: Apply externally BID x 10                            days. If still with problems with hemorrhoids,                            would recommend surgical consultation with Dr.  Lininger for possible EUA/hemorrhoidectomy. Procedure Code(s):        --- Professional ---                           Z0092, Colorectal cancer screening; colonoscopy on                            individual not meeting criteria for high risk Diagnosis Code(s):        --- Professional ---                           Z12.11, Encounter for screening for malignant                            neoplasm of colon                           K64.3, Fourth degree hemorrhoids                           K57.30, Diverticulosis of large intestine without                            perforation or abscess without bleeding CPT copyright 2019 [employer]. All rights reserved. The codes documented in this report are preliminary and upon coder review may  be revised to meet current compliance requirements. Lynann Bologna, MD 06/07/2022 10:06:22 AM This report has been signed electronically. Number of Addenda: 0

## 2022-06-07 NOTE — Anesthesia Postprocedure Evaluation (Signed)
Anesthesia Post Note  Patient: Laura Hutchinson  Procedure(s) Performed: COLONOSCOPY WITH PROPOFOL ESOPHAGOGASTRODUODENOSCOPY (EGD) WITH PROPOFOL BIOPSY [place]     Patient location during evaluation: PACU Anesthesia Type: MAC Level of consciousness: awake and alert Pain management: pain level controlled Vital Signs Assessment: post-procedure vital signs reviewed and stable Respiratory status: spontaneous breathing, nonlabored ventilation, respiratory function stable and patient connected to nasal cannula oxygen Cardiovascular status: stable and blood pressure returned to baseline Postop Assessment: no apparent nausea or vomiting Anesthetic complications: no   No notable events documented.  Last Vitals:  Vitals:   06/07/22 0958 06/07/22 1008  BP: (!) 150/69 (!) 140/64  Pulse: 92 73  Resp: 16 17  Temp: 36.7 C   SpO2: 98% 99%    Last Pain:  Vitals:   06/07/22 1008  TempSrc:   PainSc: 0-No pain                 Gisselle Galvis S

## 2022-06-07 NOTE — Op Note (Signed)
[facility] Patient Name: Laura Hutchinson Procedure Date: 06/07/2022 MRN: [id number] Attending MD: Jackquline Denmark , MD Date of Birth: Jul 05, 1966 CSN: [id number] Age: 56 Admit Type: Outpatient Procedure:                Upper GI endoscopy Indications:              Dysphagia. GERD Providers:                Jackquline Denmark, MD, Burtis Junes, RN, Luan Moore,                            Technician, Brien Mates, RNFA Referring MD:              Medicines:                Monitored Anesthesia Care Complications:            No immediate complications. Estimated Blood Loss:     Estimated blood loss: none. Procedure:                Pre-Anesthesia Assessment:                           - Prior to the procedure, a History and Physical                            was performed, and patient medications and                            allergies were reviewed. The patient's tolerance of                            previous anesthesia was also reviewed. The risks                            and benefits of the procedure and the sedation                            options and risks were discussed with the patient.                            All questions were answered, and informed consent                            was obtained. Prior Anticoagulants: The patient has                            taken no previous anticoagulant or antiplatelet                            agents. ASA Grade Assessment: II - A patient with                            mild systemic disease. After reviewing the risks  and benefits, the patient was deemed in                            satisfactory condition to undergo the procedure.                           After obtaining informed consent, the endoscope was                            passed under direct vision. Throughout the                            procedure, the patient's blood pressure, pulse, and                            oxygen  saturations were monitored continuously. The                            [id number]-[id number] ([id number]) Olympus endoscope was introduced                            through the mouth, and advanced to the second part                            of duodenum. The upper GI endoscopy was                            accomplished without difficulty. The patient                            tolerated the procedure well. Scope In: Scope Out: Findings:      The examined esophagus was normal except for few circular and linear       rings in midesophagus. Z-line was well-defined. Multiple biopsies were       obtained from the proximal and distal esophagus with cold forceps for       histology to r/o eosinophilic esophagitis. The scope was withdrawn.       Dilation was performed with a Maloney dilator with mild resistance at 50       Fr and 52 Fr.      Localized mild inflammation characterized by erythema was found in the       gastric antrum. Biopsies were taken with a cold forceps for histology.      Prominent Brunner's glands in the first portion of the duodenum. Likely       a normal variant. Multiple biopsies were obtained and sent for histology       in a separate jar. Rest of duodenum was normal. Biopsies for histology       were taken with a cold forceps for evaluation of celiac disease. Impression:               - Few circular and linear esophageal rings. ?                            Importance (s/p esophageal dilatation and bx to  eval for EoE)                           - Mild Gastritis. Biopsied.                           - Prominent Brunner's glands-likely a normal                            variant. Biopsied.                           - Otherwise normal EGD. Moderate Sedation:      Not Applicable - Patient had care per Anesthesia. Recommendation:           - Patient has a contact number available for                            emergencies. The signs and symptoms of potential                             delayed complications were discussed with the                            patient. Return to normal activities tomorrow.                            Written discharge instructions were provided to the                            patient.                           - Post dilatation diet.                           - Continue present medications. Can take Protonix                            40mg  1/2hr before supper. If still with nocturnal                            problems, can add Pepcid 20 mg p.o. nightly.                           - Nonpharmacologic means of reflux control.                            Brochures regarding GERD.                           - Await pathology results.                           - If continued cough/sinus problems, would  recommend ENT consultation.                           - FU GI if still with problems.                           - The findings and recommendations were discussed                            with the patient's family. Procedure Code(s):        --- Professional ---                           [phone], Esophagogastroduodenoscopy, flexible,                            transoral; with biopsy, single or multiple Diagnosis Code(s):        --- Professional ---                           K29.70, Gastritis, unspecified, without bleeding                           R13.10, Dysphagia, unspecified CPT copyright 2019 [employer]. All rights reserved. The codes documented in this report are preliminary and upon coder review may  be revised to meet current compliance requirements. Lynann Bologna, MD 06/07/2022 9:57:57 AM This report has been signed electronically. Number of Addenda: 0

## 2022-06-07 NOTE — Transfer of Care (Signed)
Immediate Anesthesia Transfer of Care Note  Patient: Laura Hutchinson  Procedure(s) Performed: COLONOSCOPY WITH PROPOFOL ESOPHAGOGASTRODUODENOSCOPY (EGD) WITH PROPOFOL BIOPSY MALONEY DILATION  Patient Location: PACU and Endoscopy Unit  Anesthesia Type:MAC  Level of Consciousness: awake, alert  and oriented  Airway & Oxygen Therapy: Patient Spontanous Breathing and patient coughing - oxygen mask removed.  Post-op Assessment: Report given to RN and Post -op Vital signs reviewed and stable  Post vital signs: Reviewed and stable  Last Vitals:  Vitals Value Taken Time  BP 150/69 06/07/22 1000  Temp    Pulse 90 06/07/22 1000  Resp 13 06/07/22 1000  SpO2 97 % 06/07/22 1000  Vitals shown include unvalidated device data.  Last Pain:  Vitals:   06/07/22 0850  TempSrc: Oral  PainSc: 0-No pain         Complications: No notable events documented.

## 2022-06-07 NOTE — Anesthesia Preprocedure Evaluation (Signed)
Anesthesia Evaluation  Patient identified by MRN, date of birth, ID band Patient awake    Reviewed: Allergy & Precautions, NPO status , Patient's Chart, lab work & pertinent test results  Airway Mallampati: II  TM Distance: >3 FB Neck ROM: Full    Dental no notable dental hx.    Pulmonary neg pulmonary ROS,    Pulmonary exam normal breath sounds clear to auscultation       Cardiovascular negative cardio ROS Normal cardiovascular exam Rhythm:Regular Rate:Normal     Neuro/Psych negative neurological ROS  negative psych ROS   GI/Hepatic Neg liver ROS, GERD  ,  Endo/Other    Renal/GU negative Renal ROS  negative genitourinary   Musculoskeletal negative musculoskeletal ROS (+)   Abdominal   Peds negative pediatric ROS (+)  Hematology negative hematology ROS (+)   Anesthesia Other Findings   Reproductive/Obstetrics negative OB ROS                             Anesthesia Physical Anesthesia Plan  ASA: 2  Anesthesia Plan: MAC   Post-op Pain Management: Minimal or no pain anticipated   Induction: Intravenous  PONV Risk Score and Plan: 2 and Propofol infusion and Treatment may vary due to age or medical condition  Airway Management Planned: Simple Face Mask  Additional Equipment:   Intra-op Plan:   Post-operative Plan:   Informed Consent: I have reviewed the patients History and Physical, chart, labs and discussed the procedure including the risks, benefits and alternatives for the proposed anesthesia with the patient or authorized representative who has indicated his/her understanding and acceptance.     Dental advisory given  Plan Discussed with: CRNA and Surgeon  Anesthesia Plan Comments:         Anesthesia Quick Evaluation

## 2022-06-07 NOTE — Anesthesia Procedure Notes (Signed)
Procedure Name: MAC Date/Time: 06/07/2022 9:36 AM  Performed by: Lollie Sails, CRNAPre-anesthesia Checklist: Patient identified, Emergency Drugs available, Suction available, Patient being monitored and Timeout performed Oxygen Delivery Method: Simple face mask Placement Confirmation: positive ETCO2

## 2022-06-08 LAB — SURGICAL PATHOLOGY

## 2022-06-11 ENCOUNTER — Encounter (HOSPITAL_COMMUNITY): Payer: Self-pay | Admitting: Gastroenterology

## 2022-06-23 ENCOUNTER — Encounter: Payer: Self-pay | Admitting: Gastroenterology

## 2022-12-26 ENCOUNTER — Other Ambulatory Visit: Payer: Self-pay | Admitting: Vascular Surgery

## 2022-12-26 DIAGNOSIS — Z1231 Encounter for screening mammogram for malignant neoplasm of breast: Secondary | ICD-10-CM

## 2023-04-09 ENCOUNTER — Ambulatory Visit: Payer: BC Managed Care – PPO

## 2023-04-30 ENCOUNTER — Ambulatory Visit: Payer: Self-pay

## 2023-05-09 ENCOUNTER — Ambulatory Visit: Payer: Self-pay

## 2024-08-05 ENCOUNTER — Other Ambulatory Visit: Payer: Self-pay | Admitting: Family Medicine

## 2024-08-05 DIAGNOSIS — Z1231 Encounter for screening mammogram for malignant neoplasm of breast: Secondary | ICD-10-CM

## 2024-08-21 ENCOUNTER — Ambulatory Visit

## 2024-09-01 ENCOUNTER — Ambulatory Visit
Admission: RE | Admit: 2024-09-01 | Discharge: 2024-09-01 | Disposition: A | Discharge status: Home / Self Care | Source: Ambulatory Visit | Attending: Family Medicine | Admitting: Family Medicine

## 2024-09-01 DIAGNOSIS — Z1231 Encounter for screening mammogram for malignant neoplasm of breast: Secondary | ICD-10-CM

## 2024-09-15 ENCOUNTER — Ambulatory Visit
